# Patient Record
Sex: Female | Born: 1960 | Race: Black or African American | Hispanic: No | Marital: Single | Smoking: Never smoker
Health system: Southern US, Community
[De-identification: ages and names within clinical notes are randomized; demographics above are authoritative.]

## PROBLEM LIST (undated history)

## (undated) DIAGNOSIS — Z860100 Personal history of colon polyps, unspecified: Secondary | ICD-10-CM

## (undated) HISTORY — DX: Personal history of colon polyps, unspecified: Z86.0100

## (undated) HISTORY — PX: GASTRIC BYPASS: SHX52

## (undated) HISTORY — PX: TUBAL LIGATION: SHX77

## (undated) HISTORY — PX: HIP SURGERY: SHX245

---

## 2012-01-16 ENCOUNTER — Observation Stay: Payer: Self-pay | Admitting: Internal Medicine

## 2012-01-16 LAB — FERRITIN: Ferritin (ARMC): 61 ng/mL (ref 8–388)

## 2012-01-16 LAB — URINALYSIS, COMPLETE
Bilirubin,UR: NEGATIVE
Blood: NEGATIVE
Glucose,UR: NEGATIVE mg/dL (ref 0–75)
Ketone: NEGATIVE
Ph: 7 (ref 4.5–8.0)
Protein: NEGATIVE
Specific Gravity: 1.006 (ref 1.003–1.030)
WBC UR: 1 /HPF (ref 0–5)

## 2012-01-16 LAB — CBC
HCT: 20.9 % — ABNORMAL LOW (ref 35.0–47.0)
HGB: 6.6 g/dL — ABNORMAL LOW (ref 12.0–16.0)
MCV: 79 fL — ABNORMAL LOW (ref 80–100)
Platelet: 401 10*3/uL (ref 150–440)
RDW: 19.3 % — ABNORMAL HIGH (ref 11.5–14.5)
WBC: 4.3 10*3/uL (ref 3.6–11.0)

## 2012-01-16 LAB — HEPATIC FUNCTION PANEL A (ARMC)
Bilirubin, Direct: 0.2 mg/dL (ref 0.00–0.20)
Bilirubin,Total: 0.4 mg/dL (ref 0.2–1.0)
SGOT(AST): 29 U/L (ref 15–37)
Total Protein: 7.3 g/dL (ref 6.4–8.2)

## 2012-01-16 LAB — RETICULOCYTES: Absolute Retic Count: 0.0928 10*6/uL (ref 0.023–0.096)

## 2012-01-16 LAB — BASIC METABOLIC PANEL
Anion Gap: 7 (ref 7–16)
BUN: 7 mg/dL (ref 7–18)
Chloride: 110 mmol/L — ABNORMAL HIGH (ref 98–107)
Creatinine: 0.76 mg/dL (ref 0.60–1.30)
EGFR (Non-African Amer.): >60
Potassium: 3.8 mmol/L (ref 3.5–5.1)
Sodium: 141 mmol/L (ref 136–145)

## 2012-01-16 LAB — PRO B NATRIURETIC PEPTIDE: B-Type Natriuretic Peptide: 268 pg/mL — ABNORMAL HIGH (ref 0–125)

## 2012-01-16 LAB — FOLATE: Folic Acid: 3.6 ng/mL (ref 3.1–100.0)

## 2012-01-16 LAB — PROTIME-INR: Prothrombin Time: 13.1 secs (ref 11.5–14.7)

## 2012-01-17 LAB — BASIC METABOLIC PANEL
Anion Gap: 9 (ref 7–16)
BUN: 5 mg/dL — ABNORMAL LOW (ref 7–18)
Calcium, Total: 8.3 mg/dL — ABNORMAL LOW (ref 8.5–10.1)
Chloride: 109 mmol/L — ABNORMAL HIGH (ref 98–107)
Co2: 25 mmol/L (ref 21–32)
Creatinine: 0.77 mg/dL (ref 0.60–1.30)
EGFR (Non-African Amer.): >60
Osmolality: 281 (ref 275–301)
Potassium: 4 mmol/L (ref 3.5–5.1)
Sodium: 143 mmol/L (ref 136–145)

## 2012-01-17 LAB — CBC WITH DIFFERENTIAL/PLATELET
Basophil %: 1.3 %
Eosinophil %: 3.6 %
HGB: 8 g/dL — ABNORMAL LOW (ref 12.0–16.0)
Lymphocyte %: 23.3 %
MCV: 79 fL — ABNORMAL LOW (ref 80–100)
Monocyte %: 12.9 %
Platelet: 367 10*3/uL (ref 150–440)
RDW: 18.2 % — ABNORMAL HIGH (ref 11.5–14.5)

## 2014-05-04 NOTE — H&P (Signed)
PATIENT NAME:  Caroline Galvan, KALMAN MR#:  [id number] DATE OF BIRTH:  1960-03-24  DATE OF ADMISSION:  01/16/2012.  PRIMARY M.D: None.   EMERGENCY DEPARTMENT REFERRING DOCTOR: Dr. Benjaman Lobe.   CHIEF COMPLAINT: Shortness of breath.   HISTORY OF PRESENT ILLNESS: The patient is a pleasant 54 year old African American female with previous history of anemia, according to her has not required transfusion in the past, also has a history of vitamin B12 deficiency, who reports that had her hemoglobin checked about a year ago and it was around in the 8.0 range. She reports that she has been taking vitamin B12 once a month IM for the past 3 to 4 years. She reports that in mid December, the patient had a liposuction done in both of her thighs in [place]. And she at that time took NSAIDs for pain relief for a brief period of time. But has not noticed any type of blood in her stools. She reports that since the procedure, she has had some issues with significant left leg swelling. She also over the past few days has been having dyspnea on exertion with activity. She has not had any chest pain. She reports that the swelling in the leg is much improved today. She denies any abdominal pain, nausea, vomiting, or diarrhea. To her knowledge she has not been told of any type of history of hereditary anemia.   PAST MEDICAL HISTORY: A history of anemia during when she used to heavily menstruate.   PAST SURGICAL HISTORY: 1. Status post bilateral tubal ligation.  2. Status post gastric bypass in 2000.   ALLERGIES: None.   SOCIAL HISTORY: Does not smoke. No alcohol or drug use. She currently works at [employer] and recently has moved here.   FAMILY HISTORY: Mother had anemia and according to her she is to eat a lot of clay. Two sisters also have had anemia in the past.   REVIEW OF SYSTEMS:  CONSTITUTIONAL: Denies any fevers. Complains of fatigue and weakness. No significant pain. No weight loss or weight gain.  EYES: No  blurred or double vision. No pain. No redness. No inflammation. No glaucoma. No cataracts.  ENT: Denies any tinnitus, ear pain. No hearing loss. No seasonal or year-round allergies. No difficulty swallowing.  RESPIRATORY: Denies any cough, wheezing. No hemoptysis. Complains of dyspnea on exertion. No painful respiration. No COPD. No TB. No pneumonia.  CARDIOVASCULAR: Denies any chest pain, complains of orthopnea. Complains of left leg edema. No arrhythmias.  GASTROINTESTINAL: No nausea, vomiting, diarrhea. No abdominal pain. No hematemesis. No melena. No ulcers. No GERD. No irritable bowel syndrome, no jaundice, no rectal bleeding.  GENITOURINARY: Denies any dysuria, hematuria, renal calculus, or frequency.  ENDOCRINE: Denies any polydipsia, nocturia, or thyroid problems.  HEMATOLOGIC/LYMPH: Has a history of anemia. Denies easy bruisability or bleeding.  SKIN: Denies any acne. No rash. No changes in mole, hair or skin.  MUSCULOSKELETAL: Denies any pain in the neck, back, or shoulder.  NEUROLOGIC: Denies any numbness, weakness, CVA, or TIA or seizures.  PSYCHIATRIC: Denies any anxiety, insomnia, ADD or OCD or bipolar.   PHYSICAL EXAMINATION:  VITAL SIGNS: Temperature 98.6, pulse 86, respiratory rate 18, blood pressure 110/80.  GENERAL: The patient is obese female in no acute distress.  HEENT: Head atraumatic, normocephalic. Pupils equally round, reactive to light and accommodation. There is a conjunctival pallor but no scleral icterus. Nasal exam shows no drainage or ulceration.  OROPHARYNX: Clear without any exudate.  NECK: No thyromegaly. No carotid bruits.  CARDIOVASCULAR: Regular rate and rhythm. No murmurs, rubs, clicks, or gallops. PMI is not displaced.  LUNGS: Clear to auscultation bilaterally without any rales, rhonchi, or wheezing.  ABDOMEN: Soft, nontender, nondistended. Positive bowel sounds x 4.  EXTREMITIES: She currently has no clubbing, cyanosis, or edema.  SKIN: No rash.   LYMPHATICS: No lymph nodes palpable.  VASCULAR: Good DP, PT pulses.  PSYCHIATRIC: Not anxious or depressed.  NEUROLOGICAL: Awake, alert, oriented x 3. No focal deficits.   LABORATORY, DIAGNOSTIC, AND RADIOLOGICAL DATA: EKG normal sinus rhythm without any ST-T wave changes. Bilateral lower extremity Doppler according to the ED physician is negative. Her WBC count is 4.3, hemoglobin 6.6, platelet count is 401. Her MCV is 79. BMP: Glucose is 83, BUN 7, creatinine 0.76, sodium 141, potassium 3.8, chloride 110, CO2 is 24, calcium 8.4. D-dimer was elevated at 6.   ASSESSMENT AND PLAN: The patient is a 54 year old African American female with history of anemia who presents with dyspnea on dyspnea on exertion.   1. Anemia with mild microcytosis. There is no evidence of acute GI blood loss. It is possible that she has a baseline low hemoglobin and then recent liposuction caused her hemoglobin to be low. At this time I will start off with checking her iron, ferritin, B12 and folate levels. The patient will likely need outpatient Hematology evaluation. She reports that she has not had a hemoglobin electrophoresis. This will help if she does have some sort of underlying hereditary anemia. She, also based on her age, it is recommended to have a colonoscopy.  2. Elevated d-dimer of unclear significance. Her lower extremity Doppler is negative per her report. CT scan of the chest has been ordered per the ED physician which we will follow. Please note the patient has been consented for the blood transfusion. She understands the risks and benefits of transfusion.   TIME SPENT: 45 minutes.   ____________________________ Lafonda Mosses Posey Pronto, MD shp:jm D: 01/16/2012 13:50:30 ET T: 01/16/2012 14:42:33 ET JOB#: [id number]  cc: Justinian Miano H. Posey Pronto, MD, <Dictator> Alric Seton MD ELECTRONICALLY SIGNED 01/25/2012 8:49

## 2014-05-04 NOTE — Discharge Summary (Signed)
DATE OF BIRTH:  [birth year]  ADMITTING PHYSICIAN:  Dustin Flock, MD   DISCHARGING PHYSICIAN:  Gladstone Lighter, MD  PRIMARY CARE PHYSICIAN:  None.   [place]:  None.   DISCHARGE DIAGNOSES:  1.  Acute-on-chronic anemia secondary to iron deficiency.  2.  B12 deficiency.   DISCHARGE HOME MEDICATIONS:  Ferrous sulfate 325 mg 1 tablet p.o. daily.   DISCHARGE DIET:  Regular diet and advised to eat iron-rich foods.   DISCHARGE ACTIVITY:  As tolerated.   FOLLOWUP INSTRUCTIONS:   1.  PCP followup in 2 weeks.   2.  Hematology followup at the [facility] in 2 to 3 weeks. Information and telephone number provided for [facility].  LABORATORIES AND IMAGING STUDIES PRIOR TO DISCHARGE:  1.  WBC 4.3, hemoglobin 8.3, hematocrit 24.1, platelet count 367 and MCV 79.  2.  Sodium 143, potassium 4.0, chloride 109, bicarb 25, BUN 5, creatinine 0.7, glucose 83 and calcium of 8.3. 3.  Urinalysis negative for any infection.  4.  Ultrasound Doppler lower extremities showing no evidence of any DVT.  5.  CT of the chest for PE showing no evidence of PE, and lymph nodes in mediastinum and right hilar regions are present.  6.  Chest x-ray showing clear lung fields. No evidence of acute cardiopulmonary disease.  7.  Admission hemoglobin was 6.6.  8.  Troponin less than 0.02.  9.  ALT 40, AST 29, alk phos 135, total bili 0.4, albumin of 3.2.  10.  INR 1.0, D-dimer greater than 6. 11.  Present retic count is elevated at 3.5, absolute retic count within normal limits at 0.0928. 12.  Serum iron is low at 17, ferritin 61, folic acid 3.6.   BRIEF HOSPITAL COURSE:  The patient is a 54 year old African American female with past medical history significant for iron deficiency anemia from menorrhagia in the past, B12 deficiency, for which she is on q. monthly B12 shots, presents to the ER secondary to difficulty breathing and was found to have a hemoglobin of 6.6.   Acute-on-chronic iron  deficiency anemia. Baseline hemoglobin seems to be around 8.0, presented with 6.6 and was extremely symptomatic. She was admitted and transfused with 2 units of packed RBC, which improved her hemoglobin to 8. No active bleeding noticed. She was on motrin a few months ago, and also had liposuction surgery done about a month ago. Could have been the cause for slow losses of blood. Her stool for occult blood is negative while in the hospital. After the transfusion, she felt clinically very improved and has been walking/ambulating without any distress. So, the patient is being discharged on oral iron pills, and she does not have a PCP, but she was given a number to follow up with the [facility] for her chronic anemia. Her course has been otherwise uneventful in the hospital.   DISCHARGE CONDITION:  Stable.   DISCHARGE DISPOSITION:  Home.   TIME SPENT ON DISCHARGE:  40 minutes.     ____________________________ Gladstone Lighter, MD rk:ms D: 01/17/2012 11:51:04 ET T: 01/17/2012 19:38:53 ET JOB#: [id number]  cc: Gladstone Lighter, MD, <Dictator> Dennis MD ELECTRONICALLY SIGNED 01/22/2012 14:13

## 2017-02-25 ENCOUNTER — Ambulatory Visit (INDEPENDENT_AMBULATORY_CARE_PROVIDER_SITE_OTHER): Payer: Managed Care, Other (non HMO)

## 2017-02-25 ENCOUNTER — Ambulatory Visit (INDEPENDENT_AMBULATORY_CARE_PROVIDER_SITE_OTHER): Payer: Managed Care, Other (non HMO) | Admitting: Podiatry

## 2017-02-25 ENCOUNTER — Encounter: Payer: Self-pay | Admitting: Podiatry

## 2017-02-25 VITALS — BP 130/82 | HR 78

## 2017-02-25 DIAGNOSIS — L97511 Non-pressure chronic ulcer of other part of right foot limited to breakdown of skin: Secondary | ICD-10-CM | POA: Diagnosis not present

## 2017-02-25 DIAGNOSIS — M2041 Other hammer toe(s) (acquired), right foot: Secondary | ICD-10-CM

## 2017-02-25 NOTE — Progress Notes (Signed)
   Subjective:    Patient ID: Caroline Galvan, female    DOB: 1960-08-30, 57 y.o.   MRN: [id number]  HPIthis patient presents to the office for an evaluation of an infection to her fifth toe right foot.  She was seen by [facility] who diagnosed her as having an infected fifth toe and she was given a prescription for cephalexin.  This problem has been going on for approximately 2 weeks and she has a few more days left of the antibiotic  . She says the redness and the swelling and  the pain has greatly been reduced but she does still have pain between fourth and fifth toes right foot.  She presents the office today for an evaluation of her infected fifth toe.    Review of Systems  All other systems reviewed and are negative.      Objective:   Physical Exam General Appearance  Alert, conversant and in no acute stress.  Vascular  Dorsalis pedis and posterior pulses are palpable  bilaterally.  Capillary return is within normal limits  bilaterally. Temperature is within normal limits  Bilaterally.  Neurologic  Senn-Weinstein monofilament wire test within normal limits  bilaterally. Muscle power within normal limits bilaterally.  Nails normal nails noted with no evidence of bacterial or fungal infection.  Orthopedic  No limitations of motion of motion feet bilaterally.  No crepitus or effusions noted.  Heloma molle 4th interspace right foot.  Skin  normotropic skin with no porokeratosis noted bilaterally.  No signs of infections or ulcers noted. No evidence of infection redness or drainage.         Assessment & Plan:  Infected ulcer 4,5 right foot.  Hammer toe 5th toe right   IE  X-ray was taken revealing no bony pathology or foreign body to the fifth toe right foot.  Evaluation of her right foot reveals healing of the infection has occurred.  She still has hyperkeratotic tissue between her fourth and fifth digits, right foot.  This is consistent with heloma molle.  Patient  was told to continue taking her antibiotics.  Debridement of heloma molle  was performed on the right foot.  Padding was given to this patient to separate her fourth and fifth digits, right foot. Discussed this condition and possible need for surgery in the future.  She is to return to the office as needed.   Gardiner Barefoot DPM

## 2017-06-18 ENCOUNTER — Emergency Department (HOSPITAL_COMMUNITY): Payer: Managed Care, Other (non HMO)

## 2017-06-18 ENCOUNTER — Other Ambulatory Visit: Payer: Self-pay

## 2017-06-18 ENCOUNTER — Encounter (HOSPITAL_COMMUNITY): Payer: Self-pay | Admitting: *Deleted

## 2017-06-18 ENCOUNTER — Emergency Department (HOSPITAL_COMMUNITY)
Admission: EM | Admit: 2017-06-18 | Discharge: 2017-06-18 | Disposition: A | Discharge status: Home / Self Care | Payer: Managed Care, Other (non HMO) | Attending: Emergency Medicine | Admitting: Emergency Medicine

## 2017-06-18 DIAGNOSIS — M25551 Pain in right hip: Secondary | ICD-10-CM | POA: Diagnosis present

## 2017-06-18 DIAGNOSIS — W19XXXA Unspecified fall, initial encounter: Secondary | ICD-10-CM

## 2017-06-18 MED ORDER — MORPHINE SULFATE (PF) 4 MG/ML IV SOLN
4.0000 mg | Freq: Once | INTRAVENOUS | Status: AC
  Administered 2017-06-18: 4 mg via INTRAVENOUS
  Filled 2017-06-18: qty 1

## 2017-06-18 MED ORDER — HYDROCODONE-ACETAMINOPHEN 5-325 MG PO TABS: 1.0000 | ORAL_TABLET | Freq: Four times a day (QID) | ORAL | 0 refills | Status: DC | PRN

## 2017-06-18 NOTE — ED Notes (Signed)
Patient Alert and oriented to baseline. Stable and ambulatory to baseline. Patient verbalized understanding of the discharge instructions.  Patient belongings were taken by the patient and family. Wheelchair offered, however, patient wants to ambulate at this time.

## 2017-06-18 NOTE — ED Triage Notes (Signed)
Patient presents to ed via [facility] states she was at her grandsons  Graduation and slipped in some coffe and fell landing on her right hip. States she fractured that hip over 20 years ago. C/o severe pain  In her hip . Positive right pedal pulse.

## 2017-06-18 NOTE — ED Notes (Signed)
Patient ambulating in room at this time, tolerating well.

## 2017-06-18 NOTE — ED Notes (Signed)
Pure wick applied. Patient voided 500 cc

## 2017-06-18 NOTE — ED Provider Notes (Signed)
[facility] EMERGENCY DEPARTMENT Provider Note   CSN: [id number] Arrival date & time: 06/18/17  1103     History   Chief Complaint Chief Complaint  Patient presents with  . Fall    HPI Caroline Galvan is a 57 y.o. female.omplains of right hip pain after she slipped on wet coffee and fell at her grandson's graduation 9:30 AM today. She felt well prior to the fall. No other injury. Pain is worse with moving her hipimproved with remaining still. Brought by EMS. EMS treated patient with fentanyl 100 g IV  HPI  History reviewed. No pertinent past medical history. Past medical history right hip fracture 1999 Did not receive surgery There are no active problems to display for this patient.   Past Surgical History:  Procedure Laterality Date  . CESAREAN SECTION    . GASTRIC BYPASS    . HIP SURGERY       OB History   None      Home Medications    Prior to Admission medications   Medication Sig Start Date End Date Taking? Authorizing Provider  cephALEXin (KEFLEX) 500 MG capsule TAKE 1 CAPSULE BY MOUTH THREE TIMES A DAY 02/20/17   [provider]    Family History No family history on file.  Social History Social History   Tobacco Use  . Smoking status: Never Smoker  . Smokeless tobacco: Never Used  Substance Use Topics  . Alcohol use: No    Frequency: Never  . Drug use: No     Allergies   Patient has no known allergies.   Review of Systems Review of Systems  Constitutional: Negative.   HENT: Negative.   Respiratory: Negative.   Cardiovascular: Negative.   Gastrointestinal: Negative.   Musculoskeletal: Positive for arthralgias.       Right hip pain, chronic and acute.  Skin: Negative.   Neurological: Negative.   Psychiatric/Behavioral: Negative.   All other systems reviewed and are negative.    Physical Exam Updated Vital Signs BP (!) 142/59 (BP Location: Right Arm)   Pulse 100   Temp 97.8 F (36.6 C) (Oral)   Resp  18   Ht 5\' 8"  (1.727 m)   Wt 120.2 kg (265 lb)   SpO2 100%   BMI 40.29 kg/m   Physical Exam  Constitutional: She appears well-developed and well-nourished. No distress.  HENT:  Head: Normocephalic and atraumatic.  Eyes: Pupils are equal, round, and reactive to light. Conjunctivae are normal.  Neck: Neck supple. No tracheal deviation present. No thyromegaly present.  Cardiovascular: Normal rate and regular rhythm.  No murmur heard. Pulmonary/Chest: Effort normal and breath sounds normal.  Abdominal: Soft. Bowel sounds are normal. She exhibits no distension. There is no tenderness.  Musculoskeletal: Normal range of motion. She exhibits no edema or tenderness.  Right lower extremity no deformity no swelling. She is tender at right hip and over right anterior pelvis. No crepitance. She has mild pain at hip and pelvis on internal and external rotation of thigh. DP pulse 2+. Good capillary refill. All other extremities no contusion abrasion or tenderness neurovascular intact. Entire spine nontender  Neurological: She is alert. Coordination normal.  Skin: Skin is warm and dry. No rash noted.  Psychiatric: She has a normal mood and affect.  Nursing note and vitals reviewed.    ED Treatments / Results  Labs (all labs ordered are listed, but only abnormal results are displayed) Labs Reviewed - No data to display  EKG None  X-rays viewed by me Radiology No results found.  Procedures Procedures (including critical care time)  Medications Ordered in ED Medications - No data to display  No results found for this or any previous visit. Ct Hip Right Wo Contrast  Result Date: 06/18/2017 CLINICAL DATA:  Right hip pain due to a fall today. Initial encounter. EXAM: CT OF THE RIGHT HIP WITHOUT CONTRAST TECHNIQUE: Multidetector CT imaging of the right hip was performed according to the standard protocol. Multiplanar CT image reconstructions were also generated. COMPARISON:  Plain films right  hip this same day. FINDINGS: Bones/Joint/Cartilage There is no acute bony or joint abnormality. No focal bony lesion. No avascular necrosis of the femoral head. Mild joint space narrowing posteriorly is seen. No osteophytosis, subchondral cyst formation or sclerosis. There is mild degenerative disease about the symphysis pubis. Ligaments Suboptimally assessed by CT. Muscles and Tendons Intact. Soft tissues Imaged intrapelvic contents demonstrate no acute abnormality. Small calcified uterine fibroid noted. IMPRESSION: No acute abnormality. 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 Electronically Signed   By: Inge Rise M.D.   On: 06/18/2017 15:26   Dg Hip Unilat W Or Wo Pelvis 2-3 Views Right  Result Date: 06/18/2017 CLINICAL DATA:  Severe right hip pain after falling today. History of hip fracture. EXAM: DG HIP (WITH OR WITHOUT PELVIS) 2-3V RIGHT COMPARISON:  None. FINDINGS: The bones appear adequately mineralized. There is no evidence of acute fracture, dislocation or femoral head avascular necrosis. There are no significant hip arthropathic changes. No significant posttraumatic deformities are seen. Small pelvic calcifications are likely phleboliths. There is mild lower lumbar spondylosis. IMPRESSION: No evidence of acute right hip fracture or dislocation. Electronically Signed   By: Richardean Sale M.D.   On: 06/18/2017 12:43   Initial Impression / Assessment and Plan / ED Course  I have reviewed the triage vital signs and the nursing notes.  Pertinent labs & imaging results that were available during my care of the patient were reviewed by me and considered in my medical decision making (see chart for details).     Declines pain medicine 210 PM requesting pain medicine.  IV morphine ordered.  4 PM feels improved ready to go home able to ambulate with limp favoring right leg. Prescription Norco.  Referral primary care.  Blood pressure recheck 3 weeks.[place] Controlled  Substance reporting System queried Final Clinical Impressions(s) / ED Diagnoses  #1 fall #2 contusion of right hip #3 elevated blood pressure Final diagnoses:  None    ED Discharge Orders    None       Orlie Dakin, MD 06/18/17 [phone]

## 2017-06-18 NOTE — Discharge Instructions (Addendum)
Take Tylenol for mild pain or the pain medicine prescribed for bad pain.  Do not take Tylenol together with the pain medicine prescribed as the combination can be dangerous to your liver.  Call the number on these instructions to get a primary care physician.  Your new primary care physician can see you if having significant pain in a week.  Also ask your new primary care physician to recheck your blood pressure in the next 3 weeks.  Today's was elevated at 151/77

## 2018-07-22 ENCOUNTER — Other Ambulatory Visit: Payer: Self-pay | Admitting: Obstetrics and Gynecology

## 2018-07-22 DIAGNOSIS — N6489 Other specified disorders of breast: Secondary | ICD-10-CM

## 2018-07-27 ENCOUNTER — Ambulatory Visit: Payer: Managed Care, Other (non HMO)

## 2018-07-27 ENCOUNTER — Ambulatory Visit
Admission: RE | Admit: 2018-07-27 | Discharge: 2018-07-27 | Disposition: A | Discharge status: Home / Self Care | Payer: Managed Care, Other (non HMO) | Source: Ambulatory Visit | Attending: Obstetrics and Gynecology | Admitting: Obstetrics and Gynecology

## 2018-07-27 ENCOUNTER — Other Ambulatory Visit: Payer: Self-pay

## 2018-07-27 DIAGNOSIS — N6489 Other specified disorders of breast: Secondary | ICD-10-CM

## 2018-08-04 ENCOUNTER — Ambulatory Visit: Payer: Managed Care, Other (non HMO) | Admitting: Registered"

## 2018-11-08 ENCOUNTER — Other Ambulatory Visit: Payer: Self-pay

## 2018-11-08 ENCOUNTER — Ambulatory Visit: Payer: Managed Care, Other (non HMO) | Admitting: *Deleted

## 2018-11-08 VITALS — Ht 68.0 in | Wt 275.0 lb

## 2018-11-08 DIAGNOSIS — Z1159 Encounter for screening for other viral diseases: Secondary | ICD-10-CM

## 2018-11-08 DIAGNOSIS — Z1211 Encounter for screening for malignant neoplasm of colon: Secondary | ICD-10-CM

## 2018-11-08 MED ORDER — NA SULFATE-K SULFATE-MG SULF 17.5-3.13-1.6 GM/177ML PO SOLN: 1.0000 | Freq: Once | ORAL | 0 refills | Status: AC

## 2018-11-08 NOTE — Progress Notes (Signed)

## 2018-11-08 NOTE — Progress Notes (Signed)
Pt's previsit is done over the phone and all paperwork (prep instructions, blank consent form to just read over, pre-procedure acknowledgement form and stamped envelope) sent to patient

## 2018-11-09 ENCOUNTER — Encounter: Payer: Self-pay | Admitting: Gastroenterology

## 2018-11-13 HISTORY — PX: COLONOSCOPY: SHX174

## 2018-11-17 ENCOUNTER — Other Ambulatory Visit: Payer: Self-pay | Admitting: Gastroenterology

## 2018-11-18 LAB — SARS CORONAVIRUS 2 (TAT 6-24 HRS): SARS Coronavirus 2: NEGATIVE

## 2018-11-22 ENCOUNTER — Other Ambulatory Visit: Payer: Self-pay

## 2018-11-22 ENCOUNTER — Encounter: Payer: Self-pay | Admitting: Gastroenterology

## 2018-11-22 ENCOUNTER — Ambulatory Visit (AMBULATORY_SURGERY_CENTER): Payer: Managed Care, Other (non HMO) | Admitting: Gastroenterology

## 2018-11-22 VITALS — BP 109/67 | HR 50 | Temp 98.7°F | Resp 19 | Ht 68.0 in | Wt 275.0 lb

## 2018-11-22 DIAGNOSIS — Z800 Family history of malignant neoplasm of digestive organs: Secondary | ICD-10-CM

## 2018-11-22 DIAGNOSIS — D123 Benign neoplasm of transverse colon: Secondary | ICD-10-CM

## 2018-11-22 DIAGNOSIS — Z1211 Encounter for screening for malignant neoplasm of colon: Secondary | ICD-10-CM | POA: Diagnosis not present

## 2018-11-22 DIAGNOSIS — K635 Polyp of colon: Secondary | ICD-10-CM | POA: Diagnosis not present

## 2018-11-22 MED ORDER — SODIUM CHLORIDE 0.9 % IV SOLN: 500.0000 mL | Freq: Once | INTRAVENOUS | Status: DC

## 2018-11-22 NOTE — Op Note (Signed)
[facility] Patient Name: Caroline Galvan Procedure Date: 11/22/2018 10:56 AM MRN: [id number] Endoscopist: Mauri Pole , MD Age: 58 Referring MD:  Date of Birth: 12/15/1960 Gender: Female Account #: [id number] Procedure:                Colonoscopy Indications:              Screening in patient at increased risk: Colorectal                            cancer in mother before age 62 Medicines:                Monitored Anesthesia Care Procedure:                Pre-Anesthesia Assessment:                           - Prior to the procedure, a History and Physical                            was performed, and patient medications and                            allergies were reviewed. The patient's tolerance of                            previous anesthesia was also reviewed. The risks                            and benefits of the procedure and the sedation                            options and risks were discussed with the patient.                            All questions were answered, and informed consent                            was obtained. Prior Anticoagulants: The patient has                            taken no previous anticoagulant or antiplatelet                            agents. ASA Grade Assessment: II - A patient with                            mild systemic disease. After reviewing the risks                            and benefits, the patient was deemed in                            satisfactory condition to undergo the procedure.  After obtaining informed consent, the colonoscope                            was passed under direct vision. Throughout the                            procedure, the patient's blood pressure, pulse, and                            oxygen saturations were monitored continuously. The                            Colonoscope was introduced through the anus and                            advanced to the the  cecum, identified by                            appendiceal orifice and ileocecal valve. The                            colonoscopy was technically difficult and complex                            due to inadequate bowel prep. Successful completion                            of the procedure was aided by lavage. The patient                            tolerated the procedure well. The quality of the                            bowel preparation was adequate after extensive                            lavage. The ileocecal valve, appendiceal orifice,                            and rectum were photographed. Scope In: 11:32:40 AM Scope Out: 11:53:05 AM Scope Withdrawal Time: 0 hours 14 minutes 26 seconds  Total Procedure Duration: 0 hours 20 minutes 25 seconds  Findings:                 The perianal and digital rectal examinations were                            normal.                           A 3 mm polyp was found in the transverse colon. The                            polyp was sessile. The polyp was removed with a  cold snare. Resection and retrieval were complete.                           A patchy area of mild melanosis was found in the                            entire colon.                           Non-bleeding internal hemorrhoids were found during                            retroflexion. The hemorrhoids were small. Complications:            No immediate complications. Estimated Blood Loss:     Estimated blood loss was minimal. Impression:               - One 3 mm polyp in the transverse colon, removed                            with a cold snare. Resected and retrieved.                           - Melanosis in the colon.                           - Non-bleeding internal hemorrhoids. Recommendation:           - Patient has a contact number available for                            emergencies. The signs and symptoms of potential                             delayed complications were discussed with the                            patient. Return to normal activities tomorrow.                            Written discharge instructions were provided to the                            patient.                           - Resume previous diet.                           - Continue present medications.                           - Await pathology results.                           - Repeat colonoscopy in 5 years for surveillance  based on pathology results.                           - For future colonoscopy the patient will require                            an extended preparation. If there are any                            questions, please contact the gastroenterologist. Mauri Pole, MD 11/22/2018 12:03:52 PM This report has been signed electronically.

## 2018-11-22 NOTE — Progress Notes (Signed)
Pt's states no medical or surgical changes since previsit or office visit. 

## 2018-11-22 NOTE — Progress Notes (Signed)
Temp LC Vitals CW   

## 2018-11-22 NOTE — Progress Notes (Signed)
Pt tolerated well. VSS. Arousable and to recovery.

## 2018-11-22 NOTE — Progress Notes (Signed)
Called to room to assist during endoscopic procedure.  Patient ID and intended procedure confirmed with present staff. Received instructions for my participation in the procedure from the performing physician.  

## 2018-11-22 NOTE — Patient Instructions (Signed)
HANDOUTS PROVIDED ON: POLYPS & HEMORRHOIDS   THE POLYP TAKEN TODAY HAVE BEEN SENT FOR PATHOLOGY.  THE RESULTS CAN TAKE 2-3 WEEKS TO RECEIVE.  BASED ON THE RESULTS IS WHEN YOUR NEXT COLONOSCOPY WILL BE RECOMMENDED.  YOU MAY RESUME YOUR PREVIOUS DIET AND MEDICATION SCHEDULE.  [name] YOU FOR ALLOWING Caroline Galvan TO CARE FOR YOU TODAY!!!  YOU HAD AN ENDOSCOPIC PROCEDURE TODAY AT [facility]:   Refer to the procedure report that was given to you for any specific questions about what was found during the examination.  If the procedure report does not answer your questions, please call your gastroenterologist to clarify.  If you requested that your care partner not be given the details of your procedure findings, then the procedure report has been included in a sealed envelope for you to review at your convenience later.  YOU SHOULD EXPECT: Some feelings of bloating in the abdomen. Passage of more gas than usual.  Walking can help get rid of the air that was put into your GI tract during the procedure and reduce the bloating. If you had a lower endoscopy (such as a colonoscopy or flexible sigmoidoscopy) you may notice spotting of blood in your stool or on the toilet paper. If you underwent a bowel prep for your procedure, you may not have a normal bowel movement for a few days.  Please Note:  You might notice some irritation and congestion in your nose or some drainage.  This is from the oxygen used during your procedure.  There is no need for concern and it should clear up in a day or so.  SYMPTOMS TO REPORT IMMEDIATELY:   Following lower endoscopy (colonoscopy or flexible sigmoidoscopy):  Excessive amounts of blood in the stool  Significant tenderness or worsening of abdominal pains  Swelling of the abdomen that is new, acute  Fever of 100F or higher   For urgent or emergent issues, a gastroenterologist can be reached at any hour by calling [phone].   DIET:  We do recommend a  small meal at first, but then you may proceed to your regular diet.  Drink plenty of fluids but you should avoid alcoholic beverages for 24 hours.  ACTIVITY:  You should plan to take it easy for the rest of today and you should NOT DRIVE or use heavy machinery until tomorrow (because of the sedation medicines used during the test).    FOLLOW UP: Our staff will call the number listed on your records 48-72 hours following your procedure to check on you and address any questions or concerns that you may have regarding the information given to you following your procedure. If we do not reach you, we will leave a message.  We will attempt to reach you two times.  During this call, we will ask if you have developed any symptoms of COVID 19. If you develop any symptoms (ie: fever, flu-like symptoms, shortness of breath, cough etc.) before then, please call [phone].  If you test positive for Covid 19 in the 2 weeks post procedure, please call and report this information to Caroline Galvan.    If any biopsies were taken you will be contacted by phone or by letter within the next 1-3 weeks.  Please call Caroline Galvan at [phone] if you have not heard about the biopsies in 3 weeks.    SIGNATURES/CONFIDENTIALITY: You and/or your care partner have signed paperwork which will be entered into your electronic medical record.  These signatures attest to  the fact that that the information above on your After Visit Summary has been reviewed and is understood.  Full responsibility of the confidentiality of this discharge information lies with you and/or your care-partner.

## 2018-11-24 ENCOUNTER — Telehealth: Payer: Self-pay | Admitting: *Deleted

## 2018-11-24 NOTE — Telephone Encounter (Signed)
  Follow up Call-  Call back number 11/22/2018  Post procedure Call Back phone  # [phone]  Permission to leave phone message Yes  Some recent data might be hidden     Patient questions:  Do you have a fever, pain , or abdominal swelling? No. Pain Score  0 *  Have you tolerated food without any problems? Yes.    Have you been able to return to your normal activities? Yes.    Do you have any questions about your discharge instructions: Diet   No. Medications  No. Follow up visit  No.  Do you have questions or concerns about your Care? No.  Actions: * If pain score is 4 or above: No action needed, pain <4.   1. Have you developed a fever since your procedure? no  2.   Have you had an respiratory symptoms (SOB or cough) since your procedure? no  3.   Have you tested positive for COVID 19 since your procedure no  4.   Have you had any family members/close contacts diagnosed with the COVID 19 since your procedure?  no   If yes to any of these questions please route to Joylene John, RN and Alphonsa Gin, Therapist, sports.

## 2018-11-29 ENCOUNTER — Encounter: Payer: Self-pay | Admitting: Gastroenterology

## 2021-07-14 IMAGING — MG DIGITAL DIAGNOSTIC UNILATERAL RIGHT MAMMOGRAM WITH TOMO AND CAD
6 series · 6 of 18 positions shown · non-contrast
Comparison: Previous exam(s).

CLINICAL DATA: Patient was called back from screening mammogram for
possible right breast distortion.

EXAM:
DIGITAL DIAGNOSTIC UNILATERAL RIGHT MAMMOGRAM WITH CAD AND TOMO

[R ML synth-2D]
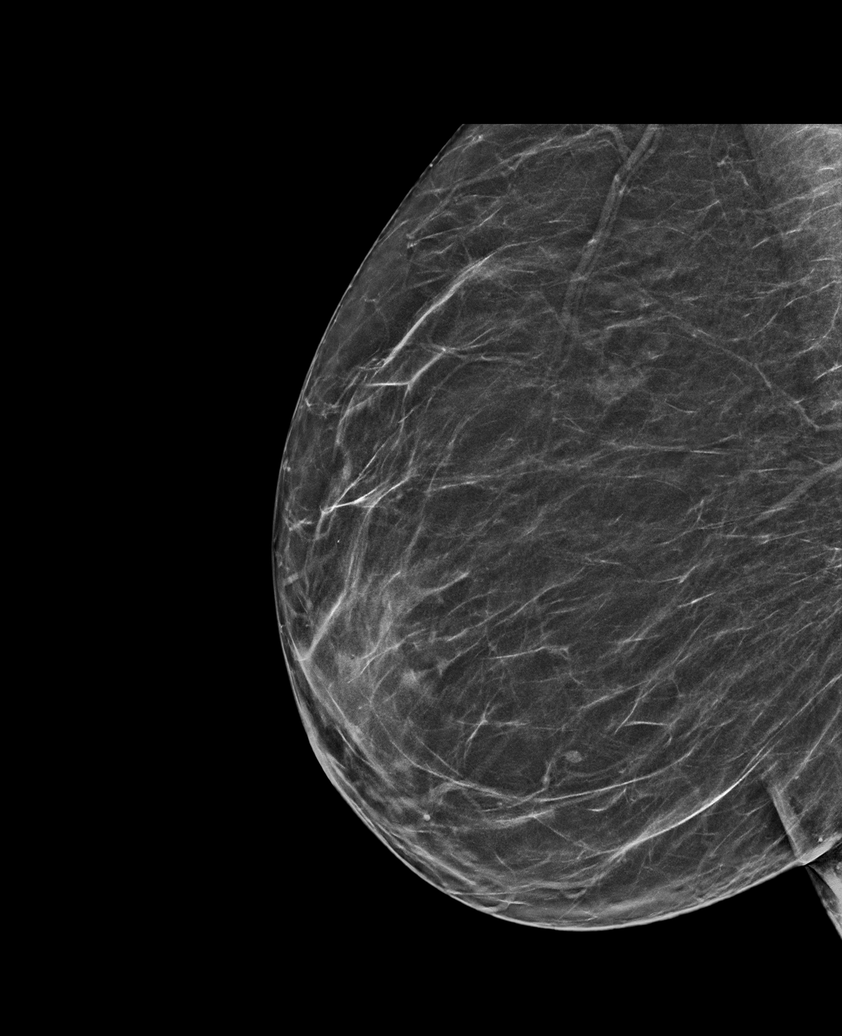

[R CC synth-2D]
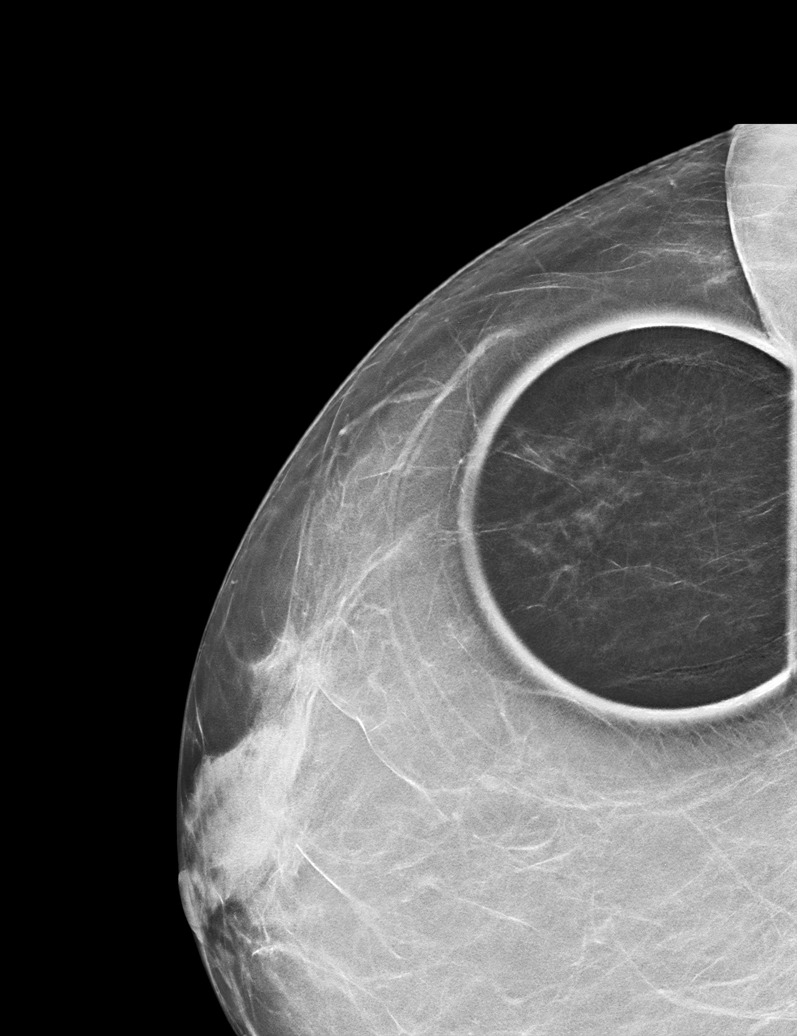

[R XCCL synth-2D]
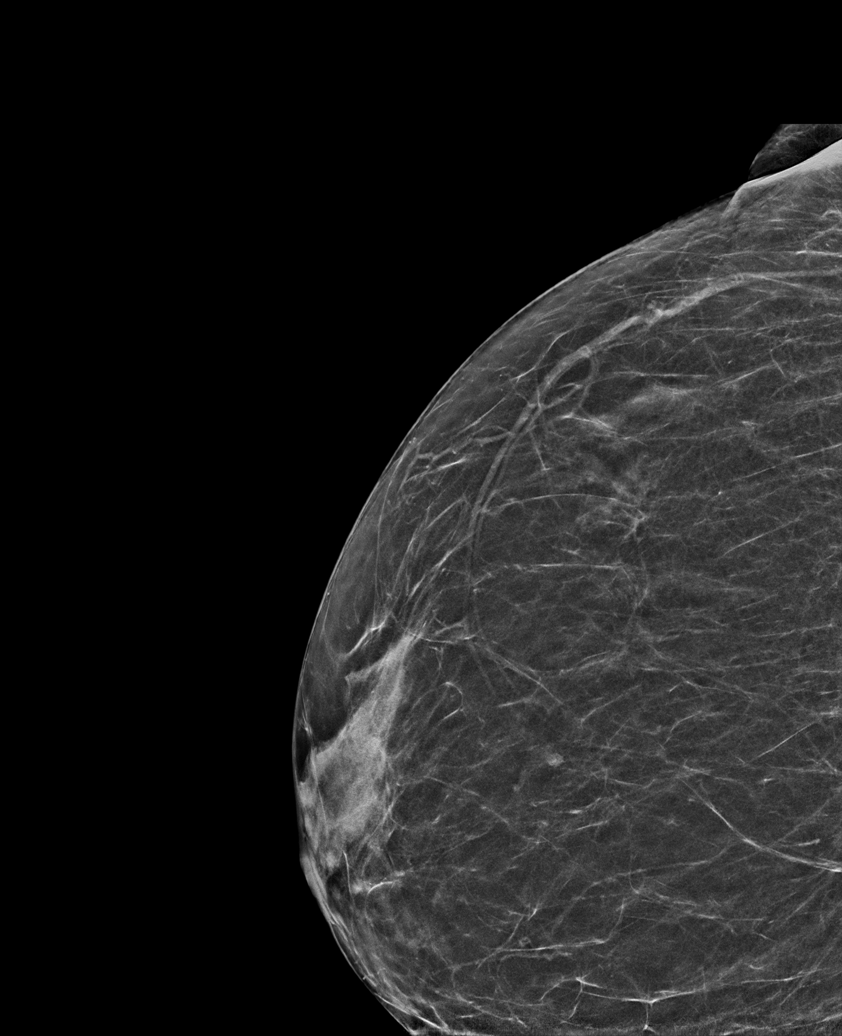

[R XCCL tomo · tomo slice 32/63.0]
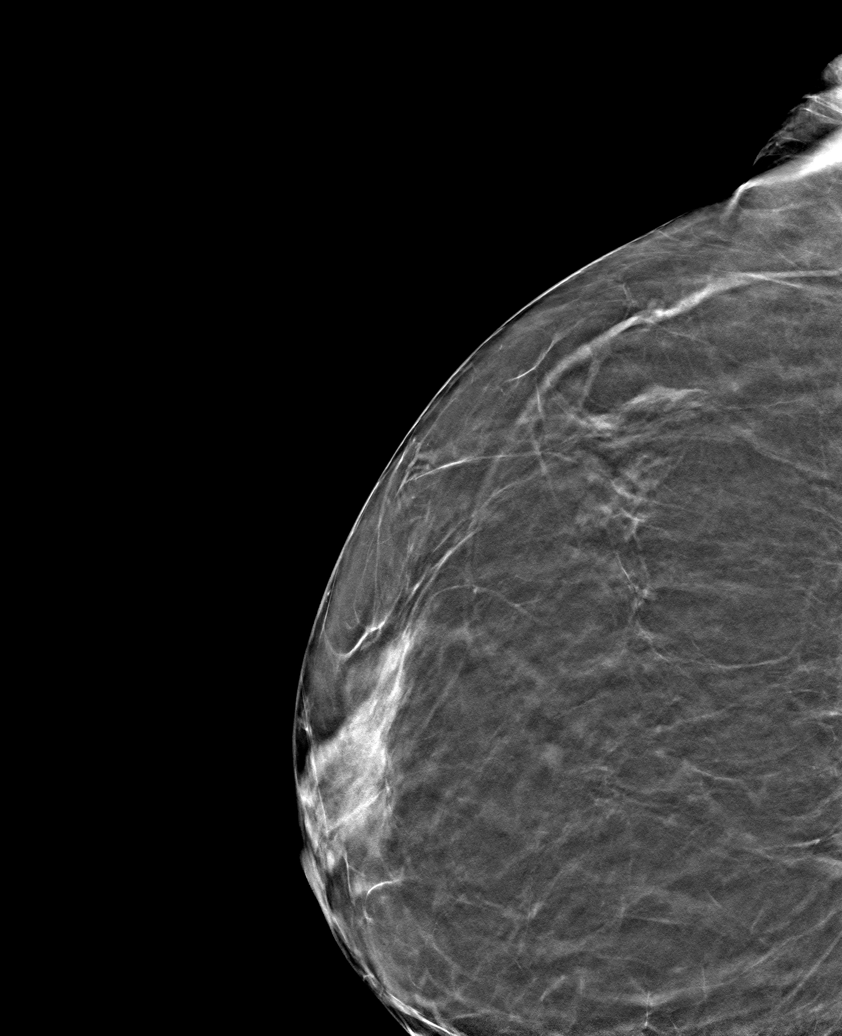

[R ML tomo · tomo slice 33/66.0]
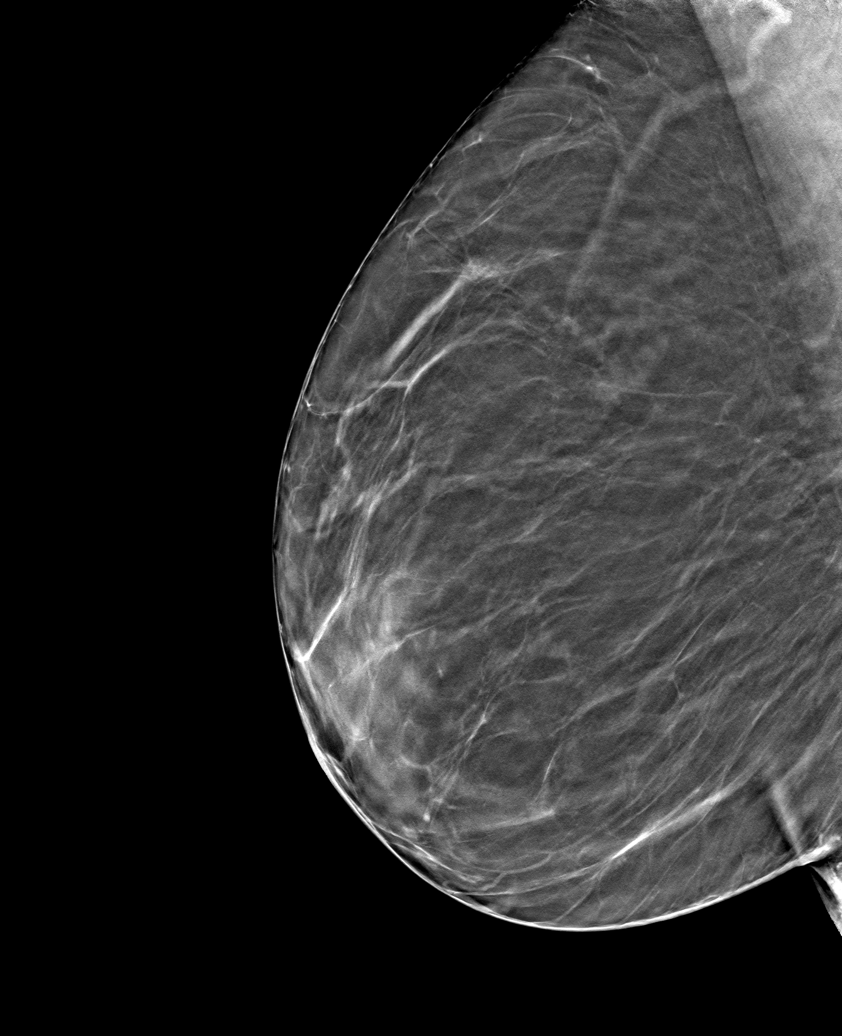

[R CC tomo · tomo slice 30/59.0]
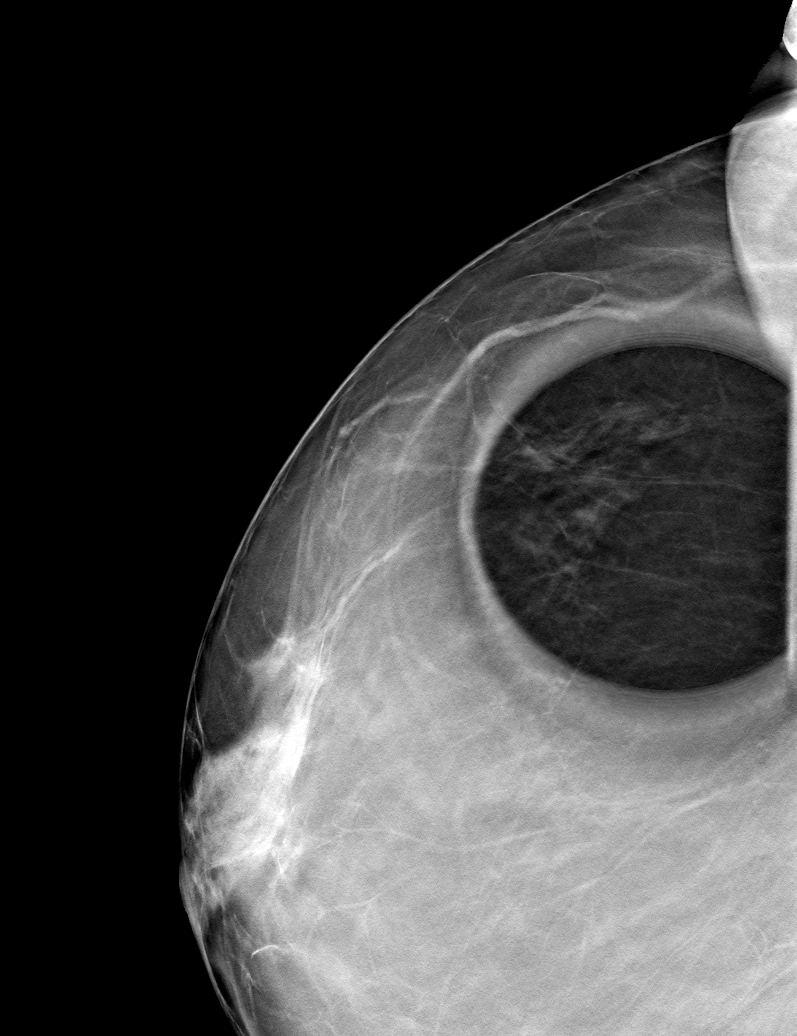

[6 of 18 positions shown; findings below may reference images not displayed]

ACR Breast Density Category b: There are scattered areas of
fibroglandular density.
FINDINGS: Additional imaging of the right breast was performed. No distortion,
mass or malignant type microcalcifications identified.

Mammographic images were processed with CAD.
IMPRESSION: No evidence of malignancy in the right breast.

RECOMMENDATION:
Bilateral screening mammogram in 1 year is recommended.

I have discussed the findings and recommendations with the patient.
Results were also provided in writing at the conclusion of the
visit. If applicable, a reminder letter will be sent to the patient
regarding the next appointment.

BI-RADS CATEGORY  1: Negative.

## 2023-01-18 ENCOUNTER — Ambulatory Visit: Payer: Managed Care, Other (non HMO) | Admitting: Podiatry

## 2023-02-17 ENCOUNTER — Encounter: Payer: Self-pay | Admitting: Nurse Practitioner

## 2023-04-07 NOTE — Progress Notes (Unsigned)
 04/07/2023 Caroline Galvan [id number] [birth year]   CHIEF COMPLAINT: GERD  HISTORY OF PRESENT ILLNESS: Caroline Galvan is a 63 year old female with a past medical history of GERD and colon polyps.  S/P tubal ligation 1983 and gastric bypass surgery 01/2018. She presents to our office today as referred by Dr. Pryor Ochoa for further evaluation regarding GERD. She is known by Dr. Lavon Paganini. She endorses having nausea which started 6 months ago with onset of vomiting 3 months ago which has progressively worsened. She initially had nausea in the morning which subsided after eating a piece of fruit and/or a nutritional bar but recurred throughout the day. She describes vomiting up food after eating 4 to 5 spoonfuls which occurs 4 to 5 days weekly. If she drinks cold water and eats warm food she will vomit. No coffee ground or frank red hematemesis. She feels better after vomiting.  She has early satiety.  She has infrequent heartburn if she lays flat at night which occurs approximately once monthly.  She elevates her head by sleeping with a few pillows at nighttime.  No dysphagia.  She takes pantoprazole 40 mg as needed, typically takes if she feels gassy with a lot of burping.  She denies ever having an EGD.  She has lost 5lbs over the past 4 months.  She denies having any abdominal pain but describes having episodes of LLQ cramping with a ball-like sensation which lasts 5 minutes and occurs once monthly for the past 6 months.  She is passing 1 or 2 normal formed brown bowel movements daily.  No bloody or black stools.  She underwent a colonoscopy 11/22/2018 which identified one 3 mm polyp removed from the transverse colon, melanosis coli and nonbleeding internal hemorrhoids.  Path report showed food material only, no colonic mucosa was identified.  Path letter from Dr. Lavon Paganini indicated colon polyp was not retrieved and a repeat colonoscopy in 5 years was recommended.  Mother with history of  colorectal cancer diagnosed at the age of 36.  She takes ibuprofen 200 mg 2 tabs once or twice monthly for right hip pain.  Colonoscopy 11/22/2018: - One 3 mm polyp in the transverse colon, removed with a cold snare. Resected and retrieved.  - Melanosis in the colon.  - Non-bleeding internal hemorrhoids. -See path report letter per Dr. Lavon Paganini, the colon polyp was not retrieved, 5 year recall colonoscopy recommended  - For future colonoscopy the patient will require an extended preparation. - FOOD MATERIAL ONLY - COLONIC MUCOSA IS NOT IDENTIFIED  Social History: She is divorced.  She has 2 daughters.  She is a Academic librarian.  She reports that she has never smoked. She has never used smokeless tobacco. She reports that she does not drink alcohol and does not use drugs.  Family History: Mother diagnosed with colorectal cancer at the age of 93, died at the age of 28. Father died from pancreatic cancer at the age of 28.   No Known Allergies   No outpatient encounter medications on file as of 04/08/2023.   No facility-administered encounter medications on file as of 04/08/2023.   REVIEW OF SYSTEMS:  Gen: Denies fever, sweats or chills. No weight loss.  CV: Denies chest pain, palpitations or edema. Resp: Denies cough, shortness of breath of hemoptysis.  GI: See HPI. GU: Denies urinary burning, blood in urine, increased urinary frequency or incontinence. MS: + Right hip pain.  Derm: Denies rash, itchiness, skin lesions or unhealing ulcers. Psych:  Denies depression, anxiety, memory loss or confusion. Heme: Denies bruising, easy bleeding. Neuro:  Denies headaches, dizziness or paresthesias. Endo:  Denies any problems with DM, thyroid or adrenal function.  PHYSICAL EXAM: BP 120/80   Ht 5\' 8"  (1.727 m)   Wt 280 lb (127 kg)   BMI 42.57 kg/m   Wt Readings from Last 3 Encounters:  04/08/23 280 lb (127 kg)  11/22/18 275 lb (124.7 kg)  11/08/18 275 lb (124.7 kg)    General:  63 year old female in no acute distress. Head: Normocephalic and atraumatic. Eyes:  Sclerae non-icteric, conjunctive pink. Ears: Normal auditory acuity. Mouth: Upper and lower dentures.  No ulcers or lesions.  Neck: Supple, no lymphadenopathy or thyromegaly.  Lungs: Clear bilaterally to auscultation without wheezes, crackles or rhonchi. Heart: Regular rate and rhythm. ?  Very soft murmur. No rub or gallop appreciated.  Abdomen: Soft, nontender, nondistended. No masses. No hepatosplenomegaly. Normoactive bowel sounds x 4 quadrants.  Rectal: Deferred.  Musculoskeletal: Symmetrical with no gross deformities. Skin: Warm and dry. No rash or lesions on visible extremities. Extremities: No edema. Neurological: Alert oriented x 4, no focal deficits.  Psychological:  Alert and cooperative. Normal mood and affect.  ASSESSMENT AND PLAN:  63 year old female with a history of GERD and prior gastric bypass surgery presents with N/V which has progressively worsened over the past 6 months.  Early satiety. Infrequent heartburn. -CBC, CMP and lipase level -RUQ sonogram to evaluate the gallbladder -EGD benefits and risks discussed including risk with sedation, risk of bleeding, perforation and infection  -Pantoprazole 40 mg daily as needed -Patient declined need for antiemetic -Recommended 3-4 small snack size meals daily -Patient to contact office if symptoms worsen prior to EGD date  Intermittent LLQ cramping, ball like sensation which occurs approximately once monthly -Patient to monitor symptoms. I discussed scheduling CTAP if symptoms persist.    Colon cancer screening. Colonoscopy 11/22/2018 which identified one 3 mm polyp removed from the transverse colon, melanosis coli and nonbleeding internal hemorrhoids.  Path report showed food material only, no colonic mucosa was identified.  Per Dr. Lavon Paganini, colon polyp was not retrieved and a repeat colonoscopy in 5 years was recommended.  Mother with  history of colorectal cancer diagnosed at the age of 22.  -Next colonoscopy due 11/2023    CC:  Edwinna Areola, *

## 2023-04-08 ENCOUNTER — Other Ambulatory Visit (INDEPENDENT_AMBULATORY_CARE_PROVIDER_SITE_OTHER)

## 2023-04-08 ENCOUNTER — Encounter: Payer: Self-pay | Admitting: Nurse Practitioner

## 2023-04-08 ENCOUNTER — Ambulatory Visit (INDEPENDENT_AMBULATORY_CARE_PROVIDER_SITE_OTHER)

## 2023-04-08 ENCOUNTER — Other Ambulatory Visit: Payer: Self-pay | Admitting: *Deleted

## 2023-04-08 ENCOUNTER — Ambulatory Visit: Payer: Managed Care, Other (non HMO) | Admitting: Nurse Practitioner

## 2023-04-08 VITALS — BP 120/80 | Ht 68.0 in | Wt 280.0 lb

## 2023-04-08 DIAGNOSIS — R112 Nausea with vomiting, unspecified: Secondary | ICD-10-CM | POA: Diagnosis not present

## 2023-04-08 DIAGNOSIS — D649 Anemia, unspecified: Secondary | ICD-10-CM

## 2023-04-08 DIAGNOSIS — Z9884 Bariatric surgery status: Secondary | ICD-10-CM

## 2023-04-08 DIAGNOSIS — R12 Heartburn: Secondary | ICD-10-CM | POA: Diagnosis not present

## 2023-04-08 DIAGNOSIS — R1032 Left lower quadrant pain: Secondary | ICD-10-CM

## 2023-04-08 DIAGNOSIS — R6881 Early satiety: Secondary | ICD-10-CM | POA: Diagnosis not present

## 2023-04-08 DIAGNOSIS — Z800 Family history of malignant neoplasm of digestive organs: Secondary | ICD-10-CM

## 2023-04-08 DIAGNOSIS — K219 Gastro-esophageal reflux disease without esophagitis: Secondary | ICD-10-CM | POA: Diagnosis not present

## 2023-04-08 LAB — CBC WITH DIFFERENTIAL/PLATELET
Basophils Absolute: 0.1 10*3/uL (ref 0.0–0.1)
Basophils Relative: 1.3 % (ref 0.0–3.0)
Eosinophils Absolute: 0.3 10*3/uL (ref 0.0–0.7)
Eosinophils Relative: 4.7 % (ref 0.0–5.0)
HCT: 25.8 % — ABNORMAL LOW (ref 36.0–46.0)
Hemoglobin: 8.2 g/dL — ABNORMAL LOW (ref 12.0–15.0)
Lymphocytes Relative: 27.4 % (ref 12.0–46.0)
Lymphs Abs: 1.5 10*3/uL (ref 0.7–4.0)
MCHC: 31.9 g/dL (ref 30.0–36.0)
MCV: 72.7 fl — ABNORMAL LOW (ref 78.0–100.0)
Monocytes Absolute: 0.5 10*3/uL (ref 0.1–1.0)
Monocytes Relative: 9.3 % (ref 3.0–12.0)
Neutro Abs: 3.2 10*3/uL (ref 1.4–7.7)
Neutrophils Relative %: 57.3 % (ref 43.0–77.0)
Platelets: 365 10*3/uL (ref 150.0–400.0)
RBC: 3.55 Mil/uL — ABNORMAL LOW (ref 3.87–5.11)
RDW: 20.7 % — ABNORMAL HIGH (ref 11.5–15.5)
WBC: 5.5 10*3/uL (ref 4.0–10.5)

## 2023-04-08 LAB — COMPREHENSIVE METABOLIC PANEL WITH GFR
ALT: 11 U/L (ref 0–35)
AST: 15 U/L (ref 0–37)
Albumin: 3.9 g/dL (ref 3.5–5.2)
Alkaline Phosphatase: 99 U/L (ref 39–117)
BUN: 11 mg/dL (ref 6–23)
CO2: 27 meq/L (ref 19–32)
Calcium: 8.9 mg/dL (ref 8.4–10.5)
Chloride: 106 meq/L (ref 96–112)
Creatinine, Ser: 0.66 mg/dL (ref 0.40–1.20)
GFR: 93.45 mL/min (ref >60.00)
Glucose, Bld: 92 mg/dL (ref 70–99)
Potassium: 4.1 meq/L (ref 3.5–5.1)
Sodium: 140 meq/L (ref 135–145)
Total Bilirubin: 0.4 mg/dL (ref 0.2–1.2)
Total Protein: 7.6 g/dL (ref 6.0–8.3)

## 2023-04-08 LAB — LIPASE: Lipase: 19 U/L (ref 11.0–59.0)

## 2023-04-08 NOTE — Patient Instructions (Signed)
 You have been scheduled for an endoscopy. Please follow written instructions given to you at your visit today.  If you use inhalers (even only as needed), please bring them with you on the day of your procedure.  If you take any of the following medications, they will need to be adjusted prior to your procedure:   DO NOT TAKE 7 DAYS PRIOR TO TEST- Trulicity (dulaglutide) Ozempic, Wegovy (semaglutide) Mounjaro (tirzepatide) Bydureon Bcise (exanatide extended release)  DO NOT TAKE 1 DAY PRIOR TO YOUR TEST Rybelsus (semaglutide) Adlyxin (lixisenatide) Victoza (liraglutide) Byetta (exanatide) ___________________________________________________________________________    Your provider has requested that you go to the basement level for lab work before leaving today. Press "B" on the elevator. The lab is located at the first door on the left as you exit the elevator.   Due to recent changes in healthcare laws, you may see the results of your imaging and laboratory studies on MyChart before your provider has had a chance to review them.  We understand that in some cases there may be results that are confusing or concerning to you. Not all laboratory results come back in the same time frame and the provider may be waiting for multiple results in order to interpret others.  Please give [place] 48 hours in order for your provider to thoroughly review all the results before contacting the office for clarification of your results.    I appreciate the  opportunity to care for you  Thank You   Alcide Evener,  NP

## 2023-04-08 NOTE — Progress Notes (Signed)
 Add on labs from the patients CBC she had this morning  Add on sheet sent to the lab and orders are in

## 2023-04-09 ENCOUNTER — Other Ambulatory Visit: Payer: Self-pay | Admitting: *Deleted

## 2023-04-09 DIAGNOSIS — D649 Anemia, unspecified: Secondary | ICD-10-CM

## 2023-04-09 LAB — VITAMIN B12: Vitamin B-12: 139 pg/mL — ABNORMAL LOW (ref 211–911)

## 2023-04-09 LAB — IBC + FERRITIN
Ferritin: 4.3 ng/mL — ABNORMAL LOW (ref 10.0–291.0)
Iron: 24 ug/dL — ABNORMAL LOW (ref 42–145)
Saturation Ratios: 5 % — ABNORMAL LOW (ref 20.0–50.0)
TIBC: 483 ug/dL — ABNORMAL HIGH (ref 250.0–450.0)
Transferrin: 345 mg/dL (ref 212.0–360.0)

## 2023-04-12 ENCOUNTER — Encounter: Payer: Self-pay | Admitting: Gastroenterology

## 2023-04-20 ENCOUNTER — Ambulatory Visit (HOSPITAL_COMMUNITY): Admission: RE | Admit: 2023-04-20 | Source: Ambulatory Visit

## 2023-04-20 ENCOUNTER — Encounter: Payer: Self-pay | Admitting: Gastroenterology

## 2023-04-20 ENCOUNTER — Ambulatory Visit (AMBULATORY_SURGERY_CENTER): Admitting: Gastroenterology

## 2023-04-20 VITALS — BP 163/97 | HR 57 | Temp 97.0°F | Resp 13 | Ht 68.0 in | Wt 280.0 lb

## 2023-04-20 DIAGNOSIS — Z9889 Other specified postprocedural states: Secondary | ICD-10-CM | POA: Diagnosis not present

## 2023-04-20 DIAGNOSIS — Z980 Intestinal bypass and anastomosis status: Secondary | ICD-10-CM | POA: Diagnosis not present

## 2023-04-20 DIAGNOSIS — K219 Gastro-esophageal reflux disease without esophagitis: Secondary | ICD-10-CM | POA: Diagnosis present

## 2023-04-20 DIAGNOSIS — R112 Nausea with vomiting, unspecified: Secondary | ICD-10-CM

## 2023-04-20 MED ORDER — SODIUM CHLORIDE 0.9 % IV SOLN: 500.0000 mL | Freq: Once | INTRAVENOUS | Status: DC

## 2023-04-20 NOTE — Progress Notes (Unsigned)
 Sedate, gd SR, tolerated procedure well, VSS, report to RN

## 2023-04-20 NOTE — Progress Notes (Signed)
 Pt's states no medical or surgical changes since previsit or office visit.

## 2023-04-20 NOTE — Patient Instructions (Signed)
 Resume previous diet Follow anti reflex regiment Follow up RUQ ultrasound   YOU HAD AN ENDOSCOPIC PROCEDURE TODAY: Refer to the procedure report and other information in the discharge instructions given to you for any specific questions about what was found during the examination. If this information does not answer your questions, please call [place] office at [phone] to clarify.   YOU SHOULD EXPECT: Some feelings of bloating in the abdomen. Passage of more gas than usual. Walking can help get rid of the air that was put into your GI tract during the procedure and reduce the bloating. If you had a lower endoscopy (such as a colonoscopy or flexible sigmoidoscopy) you may notice spotting of blood in your stool or on the toilet paper. Some abdominal soreness may be present for a day or two, also.  DIET: Your first meal following the procedure should be a light meal and then it is ok to progress to your normal diet. A half-sandwich or bowl of soup is an example of a good first meal. Heavy or fried foods are harder to digest and may make you feel nauseous or bloated. Drink plenty of fluids but you should avoid alcoholic beverages for 24 hours. If you had a esophageal dilation, please see attached instructions for diet.    ACTIVITY: Your care partner should take you home directly after the procedure. You should plan to take it easy, moving slowly for the rest of the day. You can resume normal activity the day after the procedure however YOU SHOULD NOT DRIVE, use power tools, machinery or perform tasks that involve climbing or major physical exertion for 24 hours (because of the sedation medicines used during the test).   SYMPTOMS TO REPORT IMMEDIATELY: A gastroenterologist can be reached at any hour. Please call [phone]  for any of the following symptoms:  Following upper endoscopy (EGD, EUS, ERCP, esophageal dilation) Vomiting of blood or coffee ground material  New, significant abdominal pain   New, significant chest pain or pain under the shoulder blades  Painful or persistently difficult swallowing  New shortness of breath  Black, tarry-looking or red, bloody stools  FOLLOW UP:  If any biopsies were taken you will be contacted by phone or by letter within the next 1-3 weeks. Call [phone]  if you have not heard about the biopsies in 3 weeks.  Please also call with any specific questions about appointments or follow up tests.

## 2023-04-20 NOTE — Op Note (Signed)
 [facility] Patient Name: Caroline Galvan Procedure Date: 04/20/2023 1:57 PM MRN: [id number] Endoscopist: Napoleon Form , MD, [id number] Age: 63 Referring MD:  Date of Birth: 09/24/1960 Gender: Female Account #: [id number] Procedure:                Upper GI endoscopy Indications:              Nausea with vomiting, GERD, s/p gastric bypass Medicines:                Monitored Anesthesia Care Procedure:                Pre-Anesthesia Assessment:                           - Prior to the procedure, a History and Physical                            was performed, and patient medications and                            allergies were reviewed. The patient's tolerance of                            previous anesthesia was also reviewed. The risks                            and benefits of the procedure and the sedation                            options and risks were discussed with the patient.                            All questions were answered, and informed consent                            was obtained. Prior Anticoagulants: The patient has                            taken no anticoagulant or antiplatelet agents. ASA                            Grade Assessment: III - A patient with severe                            systemic disease. After reviewing the risks and                            benefits, the patient was deemed in satisfactory                            condition to undergo the procedure.                           After obtaining informed consent, the endoscope was  passed under direct vision. Throughout the                            procedure, the patient's blood pressure, pulse, and                            oxygen saturations were monitored continuously. The                            Olympus Scope SN [id number] was introduced through the                            mouth, and advanced to the second part of duodenum.                             The upper GI endoscopy was accomplished without                            difficulty. The patient tolerated the procedure                            well. Scope In: Scope Out: Findings:                 The Z-line was regular and was found 38 cm from the                            incisors.                           No gross lesions were noted in the entire esophagus.                           Evidence of a gastric bypass was found. A gastric                            pouch with a small size was found. The staple line                            appeared intact. The gastrojejunal anastomosis was                            characterized by friable mucosa and an intact                            staple line. This was traversed. The                            pouch-to-jejunum limb was characterized by friable                            mucosa. The jejunojejunal anastomosis was                            characterized  by healthy appearing mucosa.                           The examined jejunum was normal. Complications:            No immediate complications. Estimated Blood Loss:     Estimated blood loss was minimal. Impression:               - Z-line regular, 38 cm from the incisors.                           - No gross lesions in the entire esophagus.                           - Gastric bypass with a small-sized pouch and                            intact staple line. Gastrojejunal anastomosis                            characterized by friable mucosa and an intact                            staple line.                           - Normal examined jejunum.                           - No specimens collected. Recommendation:           - Patient has a contact number available for                            emergencies. The signs and symptoms of potential                            delayed complications were discussed with the                            patient. Return to normal  activities tomorrow.                            Written discharge instructions were provided to the                            patient.                           - Resume previous diet.                           - Continue present medications.                           - Follow an antireflux regimen.                           -  Follow up RUQ ultrasound Napoleon Form, MD 04/20/2023 2:12:13 PM This report has been signed electronically.

## 2023-04-20 NOTE — Progress Notes (Unsigned)
 [facility] History and Physical   Primary Care Physician:  Patient, No Pcp Per   Reason for Procedure:  GERD, nausea, vomiting, h/o gastric bypass surgery  Plan:    EGD  with possible interventions as needed     HPI: Caroline Galvan is a very pleasant 63 y.o. female here for EGD for evaluation of GERD, nausea, vomiting, h/o gastric bypass surgery . Please refer to office visit note by Alcide Evener for additional details  The risks and benefits as well as alternatives of endoscopic procedure(s) have been discussed and reviewed. All questions answered. The patient agrees to proceed.    Past Medical History:  Diagnosis Date   History of colon polyps     Past Surgical History:  Procedure Laterality Date   COLONOSCOPY  11/13/2018   GASTRIC BYPASS     TUBAL LIGATION      Prior to Admission medications   Not on File    No current outpatient medications on file.   Current Facility-Administered Medications  Medication Dose Route Frequency Provider Last Rate Last Admin   0.9 %  sodium chloride infusion  500 mL Intravenous Once Napoleon Form, MD        Allergies as of 04/20/2023   (No Known Allergies)    Family History  Problem Relation Age of Onset   Colon cancer Mother    Rectal cancer Mother    Esophageal cancer Neg Hx    Stomach cancer Neg Hx    Colon polyps Neg Hx     Social History   Socioeconomic History   Marital status: Single    Spouse name: Not on file   Number of children: Not on file   Years of education: Not on file   Highest education level: Not on file  Occupational History   Not on file  Tobacco Use   Smoking status: Never   Smokeless tobacco: Never  Substance and Sexual Activity   Alcohol use: No   Drug use: No   Sexual activity: Not on file  Other Topics Concern   Not on file  Social History Narrative   Not on file   Social Drivers of Health   Financial Resource Strain: Not on file  Food  Insecurity: Not on file  Transportation Needs: Not on file  Physical Activity: Not on file  Stress: Not on file  Social Connections: Not on file  Intimate Partner Violence: Not on file    Review of Systems:  All other review of systems negative except as mentioned in the HPI.  Physical Exam: Vital signs in last 24 hours: BP (!) 158/84   Pulse 61   Temp (!) 97 F (36.1 C)   Ht 5\' 8"  (1.727 m)   Wt 280 lb (127 kg)   SpO2 100%   BMI 42.57 kg/m  General:   Alert, NAD Lungs:  Clear .   Heart:  Regular rate and rhythm Abdomen:  Soft, nontender and nondistended. Neuro/Psych:  Alert and cooperative. Normal mood and affect. A and O x 3  Reviewed labs, radiology imaging, old records and pertinent past GI work up  Patient is appropriate for planned procedure(s) and anesthesia in an ambulatory setting   K. Scherry Ran , MD [phone]

## 2023-04-21 ENCOUNTER — Telehealth: Payer: Self-pay

## 2023-04-21 NOTE — Telephone Encounter (Signed)
  Follow up Call-     04/20/2023    1:14 PM  Call back number  Post procedure Call Back phone  # [phone]  Permission to leave phone message Yes     Patient questions:  Do you have a fever, pain , or abdominal swelling? No. Pain Score  0 *  Have you tolerated food without any problems? Yes.    Have you been able to return to your normal activities? Yes.    Do you have any questions about your discharge instructions: Diet   No. Medications  No. Follow up visit  No.  Do you have questions or concerns about your Care? No.  Actions: * If pain score is 4 or above: No action needed, pain <4.

## 2023-04-26 DIAGNOSIS — Z9884 Bariatric surgery status: Secondary | ICD-10-CM | POA: Insufficient documentation

## 2023-04-26 DIAGNOSIS — D509 Iron deficiency anemia, unspecified: Secondary | ICD-10-CM | POA: Insufficient documentation

## 2023-04-26 DIAGNOSIS — E538 Deficiency of other specified B group vitamins: Secondary | ICD-10-CM | POA: Insufficient documentation

## 2023-04-26 NOTE — Assessment & Plan Note (Deleted)
 Continue monitor b12 and iron in the future

## 2023-04-26 NOTE — Progress Notes (Deleted)
 [facility] CONSULT NOTE  Patient Care Team: Patient, No Pcp Per as PCP - General (General Practice)  ASSESSMENT & PLAN:  63 y.o. female with history of gastric bypass surgery being seen for iron deficiency anemia. Recent labs showed iron deficiency and b12 deficiency from 03/2023. Relevant history: History of gastric bypass surgery Last colonoscopy: 11/2018 Last EGD: 04/2023 Periods:   The mechanism of IDA is due to either blood loss or decreased absorptive mechanism or both. She has a history of gastric bypass surgery. We discussed some of the risks, benefits, and alternatives of intravenous iron infusions. The patient is symptomatic from anemia and the iron level is critically low. She tolerated oral iron supplement poorly and desires to achieved higher levels of iron faster for adequate hematopoesis. Some of the side-effects to be expected including risks of infusion reactions, phlebitis, headaches, nausea and fatigue.  The patient is willing to proceed. Patient education material was dispensed.  Assessment & Plan Iron deficiency anemia secondary to inadequate dietary iron intake IV iron ordered. CBC, ferritin in 3-4 months B12 deficiency History of gastric bypass Start b12 1000 mcg daily x 7, then weekly x 4, then monthly. History of gastric bypass Continue monitor b12 and iron in the future  No orders of the defined types were placed in this encounter.     All questions were answered. The patient knows to call the clinic with any problems, questions or concerns.  Lowanda Ruddy, MD 4/14/20253:39 PM   CHIEF COMPLAINTS/PURPOSE OF CONSULTATION:  Anemia  HISTORY OF PRESENTING ILLNESS:  Taft Fabry 63 y.o. female is here because of anemia. Patient has history of gastric bypass surgery. Recent labs showed low b12 and iron.   Wylie had not noticed any recent bleeding such as melena, hematuria or hematochezia Her last colonoscopy was ***    MEDICAL  HISTORY:  Past Medical History:  Diagnosis Date   History of colon polyps     SURGICAL HISTORY: Past Surgical History:  Procedure Laterality Date   COLONOSCOPY  11/13/2018   GASTRIC BYPASS     TUBAL LIGATION      SOCIAL HISTORY: Social History   Socioeconomic History   Marital status: Single    Spouse name: Not on file   Number of children: Not on file   Years of education: Not on file   Highest education level: Not on file  Occupational History   Not on file  Tobacco Use   Smoking status: Never   Smokeless tobacco: Never  Substance and Sexual Activity   Alcohol use: No   Drug use: No   Sexual activity: Not on file  Other Topics Concern   Not on file  Social History Narrative   Not on file   Social Drivers of Health   Financial Resource Strain: Not on file  Food Insecurity: Not on file  Transportation Needs: Not on file  Physical Activity: Not on file  Stress: Not on file  Social Connections: Not on file  Intimate Partner Violence: Not on file    FAMILY HISTORY: Family History  Problem Relation Age of Onset   Colon cancer Mother    Rectal cancer Mother    Esophageal cancer Neg Hx    Stomach cancer Neg Hx    Colon polyps Neg Hx     ALLERGIES:  has no known allergies.  MEDICATIONS:  No current outpatient medications on file.   No current facility-administered medications for this visit.    REVIEW OF  SYSTEMS:   All relevant systems were reviewed with the patient and are negative.  PHYSICAL EXAMINATION: ECOG PERFORMANCE STATUS: {CHL ONC ECOG PS:[id number]}  There were no vitals filed for this visit. There were no vitals filed for this visit.  GENERAL: alert, no distress and comfortable SKIN: skin color normal EYES: normal conjunctiva, sclera clear LUNGS: normal breathing effort HEART: regular rate & rhythm ABDOMEN: abdomen soft, non-tender and nondistended  RADIOGRAPHIC STUDIES: I have personally reviewed the radiological images as listed  and agreed with the findings in the report. No results found.

## 2023-04-26 NOTE — Assessment & Plan Note (Deleted)
 History of gastric bypass Start b12 1000 mcg daily x 7, then weekly x 4, then monthly.

## 2023-04-26 NOTE — Assessment & Plan Note (Deleted)
 IV iron ordered. CBC, ferritin in 3-4 months

## 2023-04-27 ENCOUNTER — Inpatient Hospital Stay

## 2023-04-27 DIAGNOSIS — D508 Other iron deficiency anemias: Secondary | ICD-10-CM

## 2023-04-27 DIAGNOSIS — E538 Deficiency of other specified B group vitamins: Secondary | ICD-10-CM

## 2023-04-27 DIAGNOSIS — Z9884 Bariatric surgery status: Secondary | ICD-10-CM

## 2023-05-17 ENCOUNTER — Encounter: Payer: Self-pay | Admitting: *Deleted
